# Patient Record
Sex: Female | Born: 1955 | Race: White | Hispanic: No | Marital: Married | Smoking: Never smoker
Health system: Southern US, Community
[De-identification: ages and names within clinical notes are randomized; demographics above are authoritative.]

---

## 1998-04-04 ENCOUNTER — Other Ambulatory Visit: Admission: RE | Admit: 1998-04-04 | Discharge: 1998-04-04 | Payer: Self-pay | Admitting: Obstetrics and Gynecology

## 1999-04-11 ENCOUNTER — Other Ambulatory Visit: Admission: RE | Admit: 1999-04-11 | Discharge: 1999-04-11 | Payer: Self-pay | Admitting: Obstetrics and Gynecology

## 2001-06-16 ENCOUNTER — Other Ambulatory Visit: Admission: RE | Admit: 2001-06-16 | Discharge: 2001-06-16 | Payer: Self-pay | Admitting: Obstetrics and Gynecology

## 2002-08-09 ENCOUNTER — Other Ambulatory Visit: Admission: RE | Admit: 2002-08-09 | Discharge: 2002-08-09 | Payer: Self-pay | Admitting: Obstetrics and Gynecology

## 2004-09-25 ENCOUNTER — Other Ambulatory Visit: Admission: RE | Admit: 2004-09-25 | Discharge: 2004-09-25 | Payer: Self-pay | Admitting: Obstetrics and Gynecology

## 2005-10-09 ENCOUNTER — Other Ambulatory Visit: Admission: RE | Admit: 2005-10-09 | Discharge: 2005-10-09 | Payer: Self-pay | Admitting: Obstetrics and Gynecology

## 2008-04-12 ENCOUNTER — Ambulatory Visit (HOSPITAL_COMMUNITY): Admission: RE | Admit: 2008-04-12 | Discharge: 2008-04-12 | Payer: Self-pay | Admitting: Obstetrics and Gynecology

## 2008-04-12 ENCOUNTER — Encounter (INDEPENDENT_AMBULATORY_CARE_PROVIDER_SITE_OTHER): Payer: Self-pay | Admitting: Obstetrics and Gynecology

## 2011-01-06 ENCOUNTER — Other Ambulatory Visit: Payer: Self-pay | Admitting: Obstetrics and Gynecology

## 2011-01-06 DIAGNOSIS — R3129 Other microscopic hematuria: Secondary | ICD-10-CM

## 2011-01-08 ENCOUNTER — Other Ambulatory Visit: Payer: Self-pay

## 2011-01-09 ENCOUNTER — Ambulatory Visit
Admission: RE | Admit: 2011-01-09 | Discharge: 2011-01-09 | Disposition: A | Discharge status: Home / Self Care | Payer: BC Managed Care – PPO | Source: Ambulatory Visit | Attending: Obstetrics and Gynecology | Admitting: Obstetrics and Gynecology

## 2011-01-09 DIAGNOSIS — R3129 Other microscopic hematuria: Secondary | ICD-10-CM

## 2011-01-21 NOTE — Op Note (Signed)
NAME:  Courtney Pham, TOM NO.:  [id number]   MEDICAL RECORD NO.:  [id number]          PATIENT TYPE:  AMB   LOCATION:  [facility]C                           FACILITY:  [facility]   PHYSICIAN:  Miguel Aschoff, M.D.       DATE OF BIRTH:  [birth year]   DATE OF PROCEDURE:  04/12/2008  DATE OF DISCHARGE:                               OPERATIVE REPORT   PREOPERATIVE DIAGNOSIS:  Menorrhagia.   POSTOPERATIVE DIAGNOSIS:  Menorrhagia with endometrial polyps.   PROCEDURE:  Cervical dilatation, hysteroscopy, removal of endometrial  polyps followed by a NovaSure endometrial ablation.   SURGEON:  Dr. Miguel Aschoff.   ANESTHESIA:  General.   COMPLICATIONS:  None.   JUSTIFICATION:  The patient is a 55 year old white female with history  of very heavy menses, unresponsive to medical therapy.  She presents now  to undergo D and C, hysteroscopy, and endometrial ablation.  An effort  to diagnose etiology of the bleeding and control it.  The risks and  benefits of this procedure were discussed with the patient and informed  consent has been obtained.   PROCEDURE:  The patient was taken to the operating room and placed in  supine position.  General anesthesia was administered without  difficulty.  She was then placed in the dorsal lithotomy position,  prepped ,and draped in usual sterile fashion.  Once this was done,  bladder was catheterized and examination under anesthesia was carried  out which revealed normal external genitalia, normal Bartholin and Skene  glands, and normal urethra.  The patient does have a second-degree  cystocele and rectocele.  Uterus noted to be globular, retroflexed,  abnormal in size, and was smooth in contour.  No adnexal masses were  noted.  The cervix was without gross lesion.  At this point, the patient  was placed in the vaginal vault, the anterior cervical lip was grasped  with tenaculum and then sounded to 9 cm, cervical length of 4 cm was  then determined.  At this  point, the cervix was further dilated.  Once  this was done, the diagnostic hysteroscope was advanced through the  endocervical canal.  No endocervical lesions were noted.  On entering  the endometrial cavity, the patient was noted have a large polyp arising  from the right posterior portion of the uterus.  No other abnormalities  were noted.  At this point, polyp forceps were introduced and the polyp  was removed.  The hysteroscope was reintroduced to ensure that this had  been done with polyp being removed.  The scope was removed and vigorous  sharp curettage was carried out.  The endometrial curettings were sent  for histologic study as was the polyp.  At this point, the NovaSure  endometrial ablation unit was introduced, a 4 cm cavity width was then  determined and a treatment cycle was carried out for 1 minute 57 seconds  at 110 watts without difficulty.  On completion of the procedure, the  NovaSure unit was removed intact and hysteroscope was reintroduced and  appeared to be excellent coagulation of the endometrial cavity.  Once  this  was done, the cervix was injected with 20 mL 1% Xylocaine for  analgesia.  Instruments removed.  Hemostasis was achieved and the  procedure was completed.  The patient was taken out lithotomy position,  brought to recovery room in satisfactory condition.  Estimated blood  loss from the procedure was approximately 30 mL.  The patient tolerated  the procedure well.   Plan is for the patient be discharged home.  Medications for home  include Darvocet N 100 one every 4 hours as needed for pain and  doxycycline 100 mg twice a day x3 days.  The patient is to call if there  are any problems such as fever, pain, or heavy bleeding, and  will be  seen back in 4 weeks for followup examination.      Miguel Aschoff, M.D.  Electronically Signed     AR/MEDQ  D:  04/12/2008  T:  04/13/2008  Job:  [id number]

## 2011-06-06 LAB — URINALYSIS, ROUTINE W REFLEX MICROSCOPIC
Bilirubin Urine: NEGATIVE
Ketones, ur: NEGATIVE
Nitrite: NEGATIVE
pH: 7

## 2011-06-06 LAB — URINE MICROSCOPIC-ADD ON

## 2011-06-06 LAB — CBC
Platelets: 397
RBC: 4.3
WBC: 5.4

## 2011-06-06 LAB — BASIC METABOLIC PANEL
BUN: 7
Creatinine, Ser: 0.55
GFR calc Af Amer: >60
GFR calc non Af Amer: >60
Potassium: 3.5

## 2011-06-06 LAB — PROTIME-INR: Prothrombin Time: 12.6

## 2011-06-06 LAB — PREGNANCY, URINE: Preg Test, Ur: NEGATIVE

## 2011-06-06 LAB — APTT: aPTT: 26

## 2012-01-20 IMAGING — CT CT ABD-PELV W/O CM
3 of 4 series · 13 of 36 positions shown, 19 images · non-contrast
Comparison: None

CLINICAL DATA: Microscopic hematuria.  Lower abdominal pressure and
lower back pain for 1 month.  Rule out stones.

CT ABDOMEN AND PELVIS WITHOUT CONTRAST
TECHNIQUE: Multidetector CT imaging of the abdomen and pelvis was
performed following the standard protocol without intravenous
contrast.

[Series 3: renal stone · axial · 0.78mm/px · z∈[-374,-59]mm · 8 of 83 slices shown, 13 images]
[im 10/83  soft-tissue]
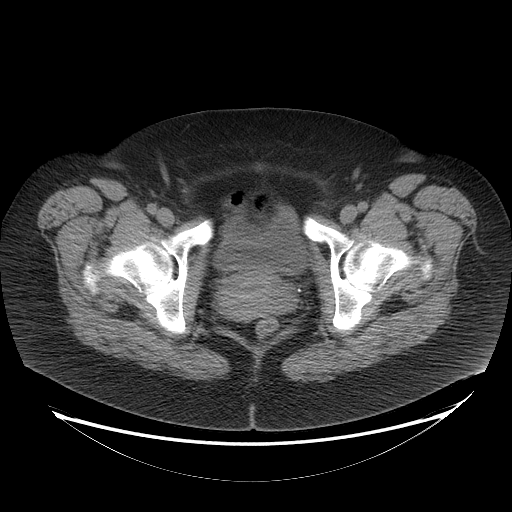
[im 10/83  bone]
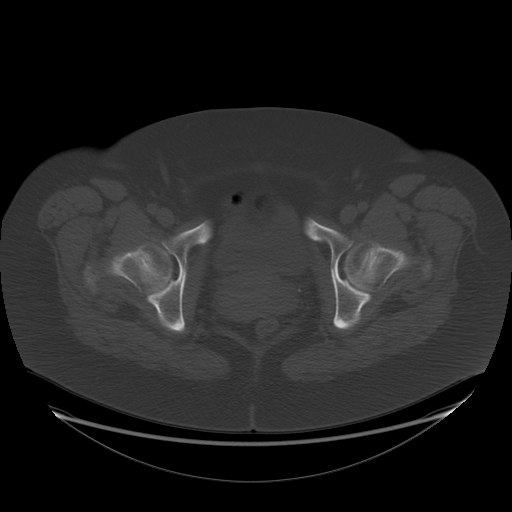
[im 19/83  soft-tissue]
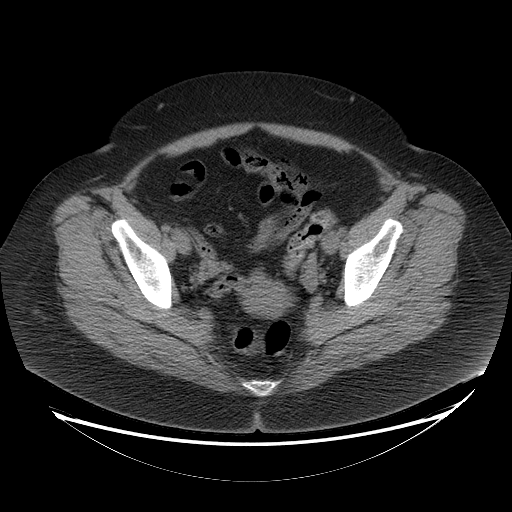
[im 28/83  soft-tissue]
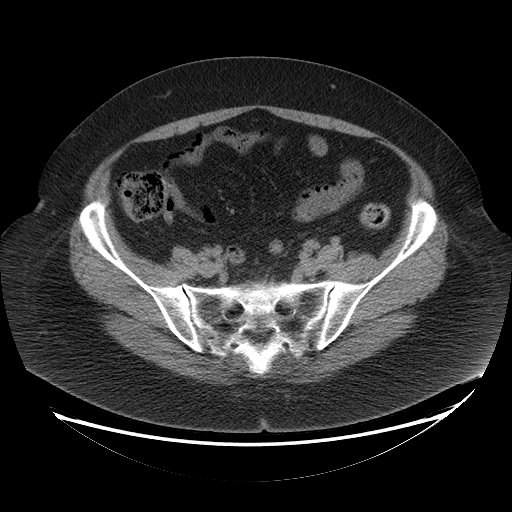
[im 37/83  soft-tissue]
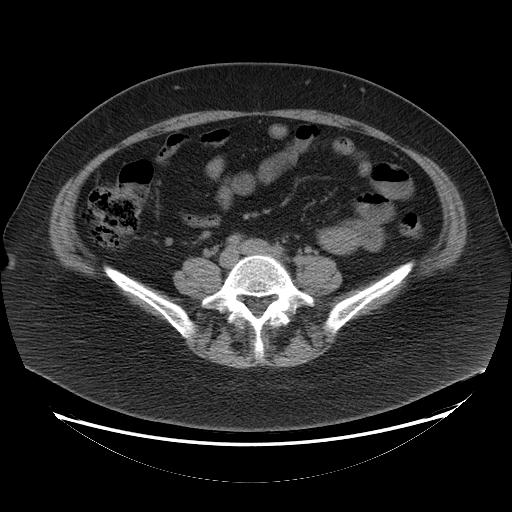
[im 46/83  soft-tissue]
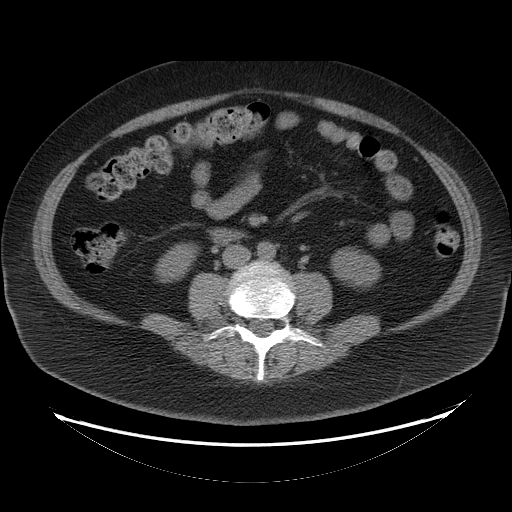
[im 46/83  lung]
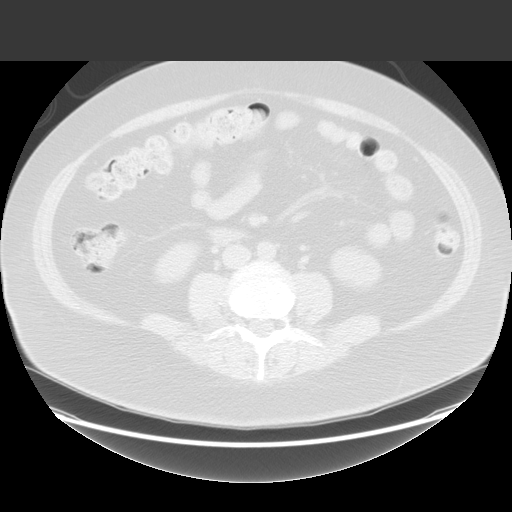
[im 55/83  soft-tissue]
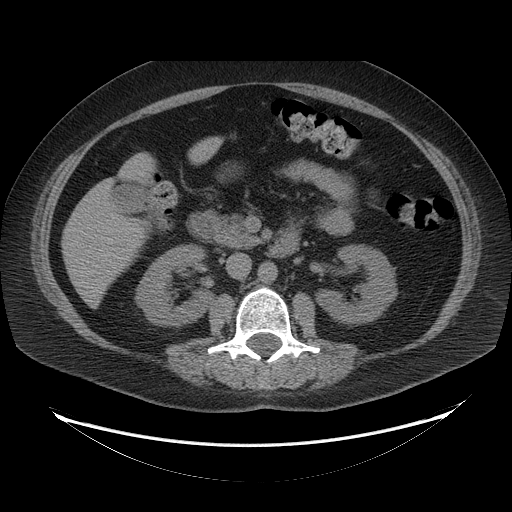
[im 55/83  lung]
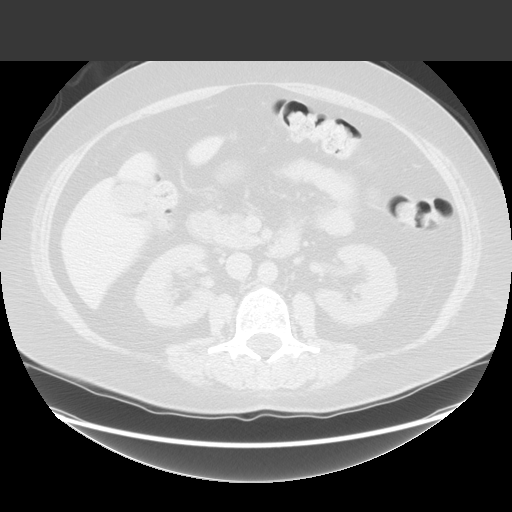
[im 64/83  soft-tissue]
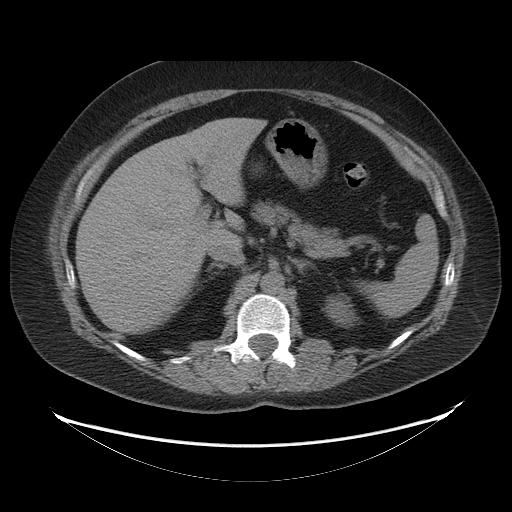
[im 64/83  lung]
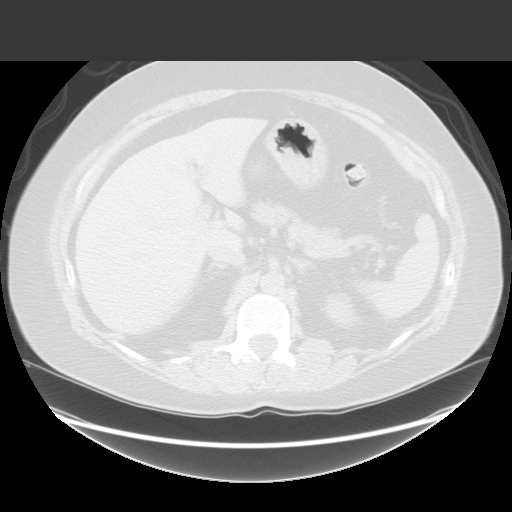
[im 73/83  soft-tissue]
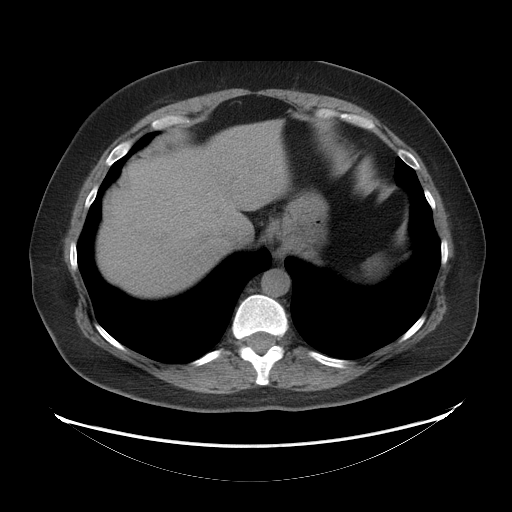
[im 73/83  lung]
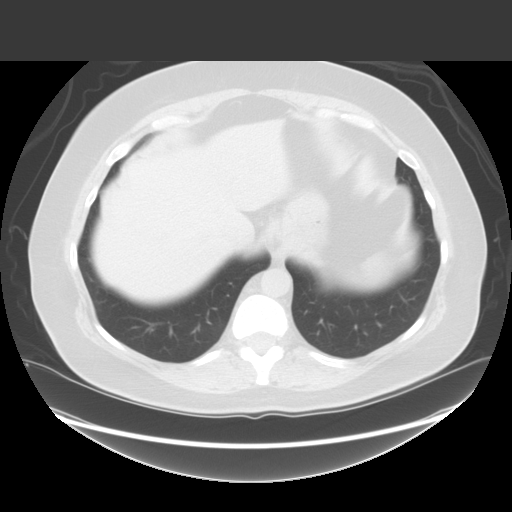

[Series 601: coronal body · coronal · 0.83mm/px · 1 of 139 slices shown, 2 images]
[im 47/139  soft-tissue]
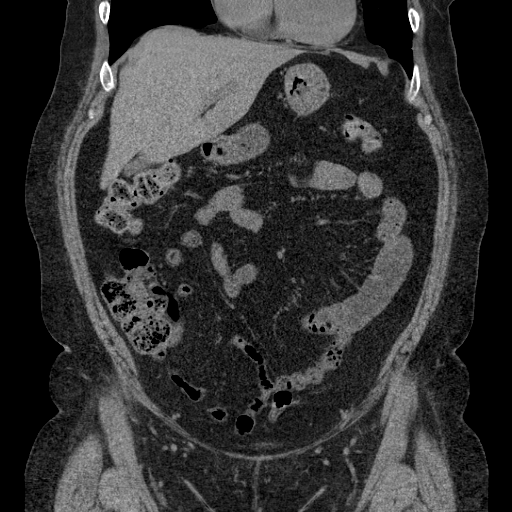
[im 47/139  bone]
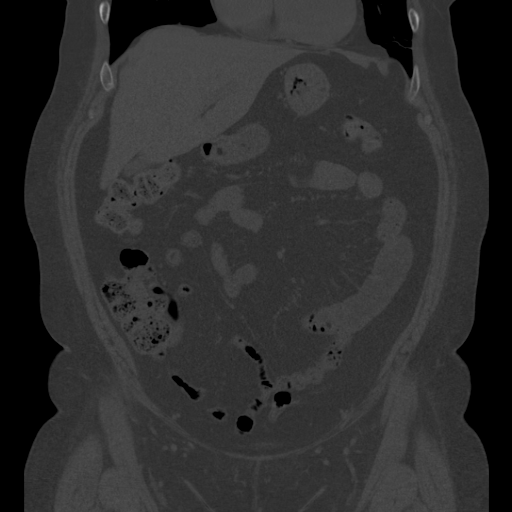

[Series 602: sagittal body · sagittal · 0.83mm/px · 4 of 161 slices shown]
[im 18/161  soft-tissue]
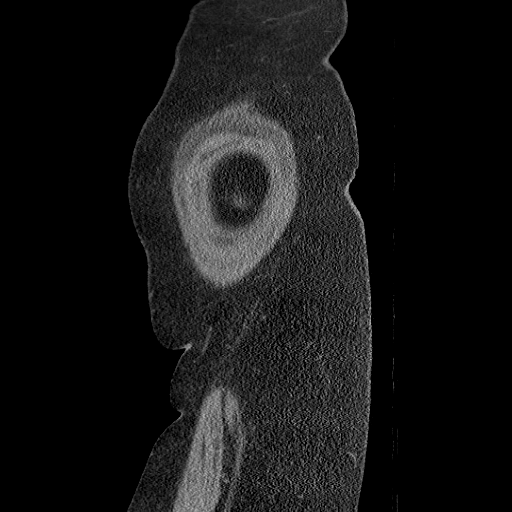
[im 36/161  soft-tissue]
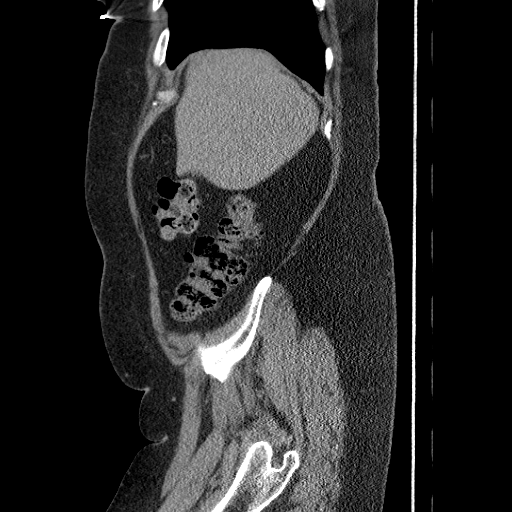
[im 54/161  soft-tissue]
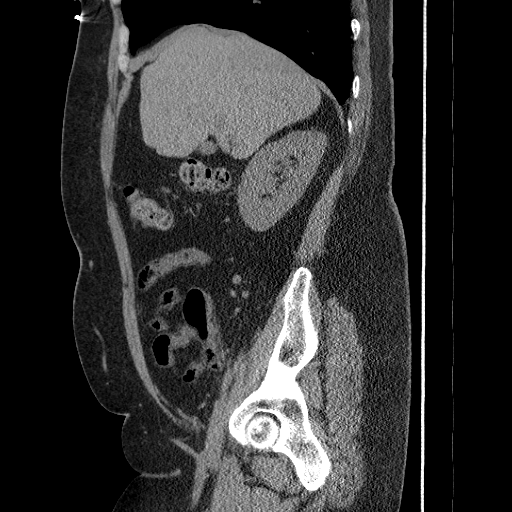
[im 72/161  soft-tissue]
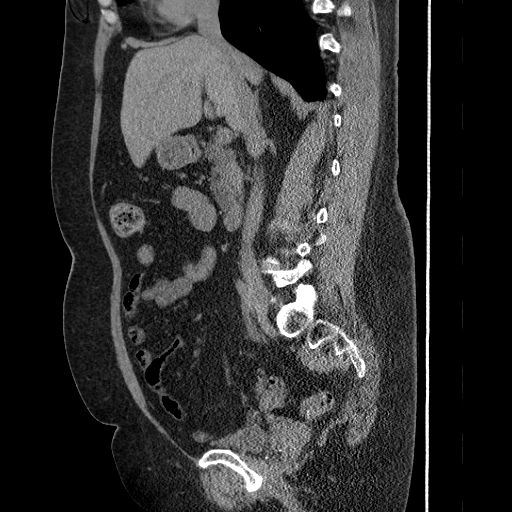

[13 of 36 positions shown; findings below may reference images not displayed]

FINDINGS: Lung bases are clear.  No focal abnormality is identified
within the liver, spleen, pancreas, adrenal glands, or kidneys.  No
intrarenal or ureteral calculi are noted.  The gallbladder is
present. No evidence for aortic aneurysm. No retroperitoneal or
mesenteric adenopathy.

The uterus is present.  No adnexal mass or free pelvic fluid
identified. Visualized bowel loops are normal in caliber and wall
thickness.

There is grade 1 retrolisthesis of L3 on L4.  Degenerate changes
are also identified at L2-3.
IMPRESSION: 11

1. No evidence for acute abdominal or pelvic abnormality.
2.  Mild degenerate changes in the spine.

## 2013-11-16 ENCOUNTER — Other Ambulatory Visit: Payer: Self-pay | Admitting: Obstetrics and Gynecology

## 2014-03-07 ENCOUNTER — Other Ambulatory Visit: Payer: Self-pay | Admitting: Orthopedic Surgery

## 2014-03-07 DIAGNOSIS — M542 Cervicalgia: Secondary | ICD-10-CM

## 2014-03-08 ENCOUNTER — Ambulatory Visit
Admission: RE | Admit: 2014-03-08 | Discharge: 2014-03-08 | Disposition: A | Discharge status: Home / Self Care | Payer: BC Managed Care – PPO | Source: Ambulatory Visit | Attending: Orthopedic Surgery | Admitting: Orthopedic Surgery

## 2014-03-08 VITALS — BP 190/99 | HR 78

## 2014-03-08 DIAGNOSIS — M5020 Other cervical disc displacement, unspecified cervical region: Secondary | ICD-10-CM

## 2014-03-08 DIAGNOSIS — M542 Cervicalgia: Secondary | ICD-10-CM

## 2014-03-08 MED ORDER — TRIAMCINOLONE ACETONIDE 40 MG/ML IJ SUSP (RADIOLOGY)
60.0000 mg | Freq: Once | INTRAMUSCULAR | Status: AC
  Administered 2014-03-08: 60 mg via EPIDURAL

## 2014-03-08 MED ORDER — IOHEXOL 300 MG/ML  SOLN
1.0000 mL | Freq: Once | INTRAMUSCULAR | Status: AC | PRN
  Administered 2014-03-08: 1 mL via EPIDURAL

## 2014-03-08 NOTE — Progress Notes (Signed)
Pt has few veins and was unable to get IV access. Dr. Carlota RaspberryLamke did injection with out IV.

## 2014-03-08 NOTE — Discharge Instructions (Signed)

## 2014-11-22 ENCOUNTER — Other Ambulatory Visit: Payer: Self-pay | Admitting: Obstetrics and Gynecology

## 2014-11-23 LAB — CYTOLOGY - PAP

## 2017-02-17 ENCOUNTER — Other Ambulatory Visit: Payer: Self-pay | Admitting: Obstetrics & Gynecology

## 2017-02-19 LAB — CYTOLOGY - PAP

## 2021-01-30 ENCOUNTER — Other Ambulatory Visit: Payer: Self-pay | Admitting: Obstetrics & Gynecology

## 2021-01-30 DIAGNOSIS — Z13820 Encounter for screening for osteoporosis: Secondary | ICD-10-CM

## 2022-04-24 ENCOUNTER — Other Ambulatory Visit: Payer: Self-pay | Admitting: Obstetrics & Gynecology

## 2022-04-24 DIAGNOSIS — Z13820 Encounter for screening for osteoporosis: Secondary | ICD-10-CM

## 2022-07-24 ENCOUNTER — Ambulatory Visit
Admission: RE | Admit: 2022-07-24 | Discharge: 2022-07-24 | Disposition: A | Discharge status: Home / Self Care | Payer: Commercial Managed Care - PPO | Source: Ambulatory Visit | Attending: Obstetrics & Gynecology | Admitting: Obstetrics & Gynecology

## 2022-07-24 DIAGNOSIS — Z13820 Encounter for screening for osteoporosis: Secondary | ICD-10-CM
